# Patient Record
Sex: Male | Born: 1980 | Hispanic: Yes | Smoking: Never smoker
Health system: Southern US, Community
[De-identification: ages and names within clinical notes are randomized; demographics above are authoritative.]

## PROBLEM LIST (undated history)

## (undated) HISTORY — PX: NO PAST SURGERIES: SHX2092

---

## 2012-12-24 ENCOUNTER — Ambulatory Visit: Payer: Self-pay

## 2012-12-24 ENCOUNTER — Other Ambulatory Visit: Payer: Self-pay | Admitting: Occupational Medicine

## 2012-12-24 DIAGNOSIS — M549 Dorsalgia, unspecified: Secondary | ICD-10-CM

## 2015-08-18 ENCOUNTER — Emergency Department (HOSPITAL_COMMUNITY): Payer: No Typology Code available for payment source

## 2015-08-18 ENCOUNTER — Emergency Department (HOSPITAL_COMMUNITY)
Admission: EM | Admit: 2015-08-18 | Discharge: 2015-08-18 | Disposition: A | Discharge status: Home / Self Care | Payer: No Typology Code available for payment source | Attending: Emergency Medicine | Admitting: Emergency Medicine

## 2015-08-18 ENCOUNTER — Encounter (HOSPITAL_COMMUNITY): Payer: Self-pay | Admitting: Emergency Medicine

## 2015-08-18 DIAGNOSIS — Y9389 Activity, other specified: Secondary | ICD-10-CM | POA: Insufficient documentation

## 2015-08-18 DIAGNOSIS — R413 Other amnesia: Secondary | ICD-10-CM | POA: Diagnosis not present

## 2015-08-18 DIAGNOSIS — S0083XA Contusion of other part of head, initial encounter: Secondary | ICD-10-CM | POA: Diagnosis not present

## 2015-08-18 DIAGNOSIS — S29002A Unspecified injury of muscle and tendon of back wall of thorax, initial encounter: Secondary | ICD-10-CM | POA: Diagnosis not present

## 2015-08-18 DIAGNOSIS — Y998 Other external cause status: Secondary | ICD-10-CM | POA: Diagnosis not present

## 2015-08-18 DIAGNOSIS — S199XXA Unspecified injury of neck, initial encounter: Secondary | ICD-10-CM | POA: Diagnosis not present

## 2015-08-18 DIAGNOSIS — Y92410 Unspecified street and highway as the place of occurrence of the external cause: Secondary | ICD-10-CM | POA: Insufficient documentation

## 2015-08-18 MED ORDER — KETOROLAC TROMETHAMINE 60 MG/2ML IM SOLN
60.0000 mg | Freq: Once | INTRAMUSCULAR | Status: AC
  Administered 2015-08-18: 60 mg via INTRAMUSCULAR
  Filled 2015-08-18: qty 2

## 2015-08-18 MED ORDER — METHOCARBAMOL 500 MG PO TABS: 500.0000 mg | ORAL_TABLET | Freq: Two times a day (BID) | ORAL | Status: DC

## 2015-08-18 MED ORDER — IBUPROFEN 400 MG PO TABS: 400.0000 mg | ORAL_TABLET | Freq: Three times a day (TID) | ORAL | Status: DC
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
## 2020-11-07 ENCOUNTER — Other Ambulatory Visit: Payer: Self-pay

## 2020-11-07 DIAGNOSIS — Z20822 Contact with and (suspected) exposure to covid-19: Secondary | ICD-10-CM

## 2020-11-08 LAB — NOVEL CORONAVIRUS, NAA: SARS-CoV-2, NAA: NOT DETECTED

## 2020-11-08 LAB — SARS-COV-2, NAA 2 DAY TAT

## 2022-02-12 ENCOUNTER — Encounter: Payer: Self-pay | Admitting: Orthopedic Surgery

## 2022-02-12 ENCOUNTER — Ambulatory Visit (INDEPENDENT_AMBULATORY_CARE_PROVIDER_SITE_OTHER): Payer: Self-pay

## 2022-02-12 ENCOUNTER — Encounter: Payer: Self-pay | Admitting: Orthopaedic Surgery

## 2022-02-12 ENCOUNTER — Ambulatory Visit (HOSPITAL_COMMUNITY)
Admission: RE | Admit: 2022-02-12 | Discharge: 2022-02-12 | Disposition: A | Discharge status: Home / Self Care | Payer: Self-pay | Source: Ambulatory Visit | Attending: Orthopaedic Surgery | Admitting: Orthopaedic Surgery

## 2022-02-12 ENCOUNTER — Ambulatory Visit (INDEPENDENT_AMBULATORY_CARE_PROVIDER_SITE_OTHER): Payer: Self-pay | Admitting: Orthopaedic Surgery

## 2022-02-12 ENCOUNTER — Ambulatory Visit (INDEPENDENT_AMBULATORY_CARE_PROVIDER_SITE_OTHER): Payer: Self-pay | Admitting: Orthopedic Surgery

## 2022-02-12 ENCOUNTER — Encounter (HOSPITAL_COMMUNITY): Payer: Self-pay | Admitting: Orthopedic Surgery

## 2022-02-12 ENCOUNTER — Other Ambulatory Visit: Payer: Self-pay

## 2022-02-12 VITALS — BP 112/74 | HR 74 | Ht 65.0 in

## 2022-02-12 DIAGNOSIS — M79671 Pain in right foot: Secondary | ICD-10-CM

## 2022-02-12 DIAGNOSIS — S93324A Dislocation of tarsometatarsal joint of right foot, initial encounter: Secondary | ICD-10-CM

## 2022-02-12 NOTE — Addendum Note (Signed)
Addended by: Cherre Huger E on: 02/12/2022 09:39 AM ? ? Modules accepted: Orders ? ?

## 2022-02-12 NOTE — Progress Notes (Signed)
? ?Subjective:  ? ? Patient ID: Tommy Potts, male    DOB: 1980/10/19, 41 y.o.   MRN: [id number] ? ?HPI ?He was up on a ladder working on a house and fell and hurt his right foot on Friday, May 5.  He had pain to continue in the foot over the weekend, he had difficulty standing and walking.  He went to [facility] yesterday, May 8.  X-rays showed Lisfranc injury with fractures of the navicular and medial cuneiform.  Medial cuneiform is medially and dorsally subluxed relative to navicular.  CT exam was recommended. ? ?He has crutches.  He has no other injury.  He has swelling of the right foot but NV intact. ? ?He has interpreter present.  I have explained the injury and the potential seriousness of it. ? ?I will arrange CT of the foot. ? ?I will be out of town until the first of next month. ? ?I will have him seen by Dr. Sharol Given in the [facility] office after the CT exam. ? ?I will give CAM walker and instructions to stay off the foot.  Go to ER if he gets worse.  His wife is also present.  I gave them the opportunity to ask questions and they appear to understand the seriousness of the injury and what may need to be done.  He is able to get to [facility] for follow-up. ? ? ?Review of Systems  ?Constitutional:  Positive for activity change.  ?Musculoskeletal:  Positive for arthralgias, gait problem, joint swelling and myalgias.  ?All other systems reviewed and are negative. ?For Review of Systems, all other systems reviewed and are negative. ? ?The following is a summary of the past history medically, past history surgically, known current medicines, social history and family history.  This information is gathered electronically by the computer from prior information and documentation.  I review this each visit and have found including this information at this point in the chart is beneficial and informative.  ? ?History reviewed. No pertinent past medical history. ? ?History reviewed. No  pertinent surgical history. ? ?Current Outpatient Medications on File Prior to Visit  ?Medication Sig Dispense Refill  ? ibuprofen (ADVIL,MOTRIN) 400 MG tablet Take 1 tablet (400 mg total) by mouth 3 (three) times daily. 30 tablet 0  ? methocarbamol (ROBAXIN) 500 MG tablet Take 1 tablet (500 mg total) by mouth 2 (two) times daily. 20 tablet 0  ? ?No current facility-administered medications on file prior to visit.  ? ? ?Social History  ? ?Socioeconomic History  ? Marital status: Unknown  ?  Spouse name: Not on file  ? Number of children: Not on file  ? Years of education: Not on file  ? Highest education level: Not on file  ?Occupational History  ? Not on file  ?Tobacco Use  ? Smoking status: Never  ? Smokeless tobacco: Not on file  ?Substance and Sexual Activity  ? Alcohol use: No  ? Drug use: No  ? Sexual activity: Not on file  ?Other Topics Concern  ? Not on file  ?Social History Narrative  ? Not on file  ? ?Social Determinants of Health  ? ?Financial Resource Strain: Not on file  ?Food Insecurity: Not on file  ?Transportation Needs: Not on file  ?Physical Activity: Not on file  ?Stress: Not on file  ?Social Connections: Not on file  ?Intimate Partner Violence: Not on file  ? ? ?History reviewed. No pertinent family history. ? ?BP 112/74  Pulse 74   Ht 5\' 5"  (1.651 m)  ? ?There is no height or weight on file to calculate BMI. ? ?   ?Objective:  ? Physical Exam ?Vitals and nursing note reviewed. Exam conducted with a chaperone present.  ?Constitutional:   ?   Appearance: He is well-developed.  ?HENT:  ?   Head: Normocephalic and atraumatic.  ?Eyes:  ?   Conjunctiva/sclera: Conjunctivae normal.  ?   Pupils: Pupils are equal, round, and reactive to light.  ?Cardiovascular:  ?   Rate and Rhythm: Normal rate and regular rhythm.  ?Pulmonary:  ?   Effort: Pulmonary effort is normal.  ?Abdominal:  ?   Palpations: Abdomen is soft.  ?Musculoskeletal:  ?   Cervical back: Normal range of motion and neck supple.  ?      Feet: ? ?Skin: ?   General: Skin is warm and dry.  ?Neurological:  ?   Mental Status: He is alert and oriented to person, place, and time.  ?   Cranial Nerves: No cranial nerve deficit.  ?   Motor: No abnormal muscle tone.  ?   Coordination: Coordination normal.  ?   Deep Tendon Reflexes: Reflexes are normal and symmetric. Reflexes normal.  ?Psychiatric:     ?   Behavior: Behavior normal.     ?   Thought Content: Thought content normal.     ?   Judgment: Judgment normal.  ? ? ? ? ? ?   ?Assessment & Plan:  ? ?Encounter Diagnosis  ?Name Primary?  ? Lisfranc dislocation, right, initial encounter Yes  ? ?To get CT of the right foot. ? ?To follow-up with Dr. Sharol Given. ? ?CAM walker with instructions given. ? ?If get worse, go to ER without delay. ? ?Call if any problem. ? ?Precautions discussed. ? ?Electronically Signed ?Sanjuana Kava, MD ?5/9/20239:36 AM ? ?

## 2022-02-12 NOTE — Progress Notes (Signed)
Spoke with pt via [employer] (Spanish) [place] ID # [id number]. Pt denies cardiac history, HTN or Diabetes.  ? ?Shower instructions given to pt.  ?

## 2022-02-12 NOTE — Progress Notes (Signed)
? ?Office Visit Note ?  ?Patient: Tommy Potts           ?Date of Birth: 10-29-1980           ?MRN: [id number] ?Visit Date: 02/12/2022 ?             ?Requested by: Sanjuana Kava, MD ?[address] ?[place],  [place] [address] ?PCP: Pcp, No ? ?Chief Complaint  ?Patient presents with  ? Right Foot - Pain  ?  Follo wup CT scan Lisfranc fx dislocation   ? ? ? ? ?HPI: ?Patient is a 41 year old gentleman who was seen with an interpreter.  Patient states that he fell off a roof on Friday, May 5 about 4 days ago.  Patient was initially seen in [place] and is seen today for initial evaluation and treatment.  Patient has had a CT scan.  Patient is currently nonweightbearing in a fracture boot and crutches. ? ?Assessment & Plan: ?Visit Diagnoses:  ?1. Pain in right foot   ?2. Lisfranc's dislocation, right, initial encounter   ? ? ?Plan: With the fracture dislocation through the Lisfranc complex patient will need a fusion from the base of the medial cuneiform to the navicular possible internal fixation or fusion from the base of the first metatarsal medial cuneiform and further stabilization between the medial cuneiform and the second metatarsal.  Risks and benefits were discussed including infection neurovascular injury numbness delayed healing of the skin nonhealing of the fusion need for additional surgery.  Patient states he understands wished to proceed at this time. ? ?Follow-Up Instructions: Return in about 1 week (around 02/19/2022).  ? ?Ortho Exam ? ?Patient is alert, oriented, no adenopathy, well-dressed, normal affect, normal respiratory effort. ?Examination patient has a strong palpable dorsalis pedis pulse there is swelling the skin is intact there is no blistering.  The calcaneus is nontender to palpation the ankle is nontender to palpation.  There is no pain with range of motion of the knee or hip.  There is no knee effusion.  Review of the CT scan shows comminution through the base of the first and second  metatarsal with the dislocation and impaction and dislocation of the medial cuneiform dislocated dorsally on the navicular. ? ?Imaging: ?CT FOOT RIGHT WO CONTRAST ? ?Result Date: 02/12/2022 ?CLINICAL DATA:  Fracture, foot lis franc dislocation right foot EXAM: CT OF THE RIGHT FOOT WITHOUT CONTRAST TECHNIQUE: Multidetector CT imaging of the right foot was performed according to the standard protocol. Multiplanar CT image reconstructions were also generated. RADIATION DOSE REDUCTION: This exam was performed according to the departmental dose-optimization program which includes automated exposure control, adjustment of the mA and/or kV according to patient size and/or use of iterative reconstruction technique. COMPARISON:  None Available. FINDINGS: Bones/Joint/Cartilage Lisfranc fracture-dislocation. Comminuted and displaced fracture involving the head plantar lateral base of the medial cuneiform. Small fracture along the plantar aspect of the second metatarsal and small fracture of the plantar medial aspect of the middle cuneiform. There are multiple small fracture fragments in the Lisfranc interval. There is medial and dorsal subluxation of the medial cuneiform with respect to the navicular, with comminuted fracture of the medial navicular due to impaction by the medial cuneiform. There is medial subluxation of the first metatarsal and medial cuneiform with widening of primarily the mid and plantar aspect of Lisfranc interval. The first and second metatarsals are well aligned with their respective TMT articulations with the medial and middle cuneiforms. Ligaments Widened Lisfranc interval with midfoot malalignment suggestive of  Lisfranc ligament tear. Muscles and Tendons No acute myotendinous abnormality by CT. Soft tissues Extensive soft tissue swelling of the foot. IMPRESSION: Lisfranc fracture-dislocation, with fractures involving the plantar lateral base of the medial cuneiform, plantar aspect of the second  metatarsal, and plantar medial aspect of the middle cuneiform. Multiple small fracture fragments in the Lisfranc interval. Medial and dorsal subluxation of the medial cuneiform, with comminuted fracture of the medial navicular due to impaction by the medial cuneiform. Electronically Signed   By: Maurine Simmering M.D.   On: 02/12/2022 11:05  ? ?XR Foot Complete Right ? ?Result Date: 02/12/2022 ?Three-view radiographs of the right foot shows a Lisfranc fracture dislocation with a fracture extending through the base of the medial cuneiform with dislocation and impaction into the navicular.  ?No images are attached to the encounter. ? ?Labs: ?No results found for: HGBA1C, ESRSEDRATE, CRP, LABURIC, REPTSTATUS, GRAMSTAIN, CULT, LABORGA ? ? ?No results found for: ALBUMIN, PREALBUMIN, CBC ? ?No results found for: MG ?No results found for: VD25OH ? ?No results found for: PREALBUMIN ?   ? View : No data to display.  ?  ?  ?  ? ? ? ?There is no height or weight on file to calculate BMI. ? ?Orders:  ?Orders Placed This Encounter  ?Procedures  ? XR Foot Complete Right  ? ?No orders of the defined types were placed in this encounter. ? ? ? Procedures: ?No procedures performed ? ?Clinical Data: ?No additional findings. ? ?ROS: ? ?All other systems negative, except as noted in the HPI. ?Review of Systems ? ?Objective: ?Vital Signs: There were no vitals taken for this visit. ? ?Specialty Comments:  ?No specialty comments available. ? ?PMFS History: ?There are no problems to display for this patient. ? ?History reviewed. No pertinent past medical history.  ?History reviewed. No pertinent family history.  ?History reviewed. No pertinent surgical history. ?Social History  ? ?Occupational History  ? Not on file  ?Tobacco Use  ? Smoking status: Never  ? Smokeless tobacco: Not on file  ?Substance and Sexual Activity  ? Alcohol use: No  ? Drug use: No  ? Sexual activity: Not on file  ? ? ? ? ? ?

## 2022-02-13 ENCOUNTER — Ambulatory Visit (HOSPITAL_COMMUNITY): Payer: Self-pay | Admitting: Anesthesiology

## 2022-02-13 ENCOUNTER — Ambulatory Visit (HOSPITAL_COMMUNITY)
Admission: RE | Admit: 2022-02-13 | Discharge: 2022-02-13 | Disposition: A | Discharge status: Home / Self Care | Payer: Self-pay | Attending: Orthopedic Surgery | Admitting: Orthopedic Surgery

## 2022-02-13 ENCOUNTER — Ambulatory Visit (HOSPITAL_COMMUNITY): Payer: Self-pay

## 2022-02-13 ENCOUNTER — Encounter (HOSPITAL_COMMUNITY)
Admission: RE | Disposition: A | Discharge status: Home / Self Care | Payer: Self-pay | Source: Home / Self Care | Attending: Orthopedic Surgery

## 2022-02-13 ENCOUNTER — Other Ambulatory Visit: Payer: Self-pay

## 2022-02-13 ENCOUNTER — Ambulatory Visit (HOSPITAL_BASED_OUTPATIENT_CLINIC_OR_DEPARTMENT_OTHER): Payer: Self-pay | Admitting: Anesthesiology

## 2022-02-13 DIAGNOSIS — S92321A Displaced fracture of second metatarsal bone, right foot, initial encounter for closed fracture: Secondary | ICD-10-CM | POA: Insufficient documentation

## 2022-02-13 DIAGNOSIS — S9781XA Crushing injury of right foot, initial encounter: Secondary | ICD-10-CM | POA: Insufficient documentation

## 2022-02-13 DIAGNOSIS — S93304A Unspecified dislocation of right foot, initial encounter: Secondary | ICD-10-CM

## 2022-02-13 DIAGNOSIS — S92251A Displaced fracture of navicular [scaphoid] of right foot, initial encounter for closed fracture: Secondary | ICD-10-CM | POA: Insufficient documentation

## 2022-02-13 DIAGNOSIS — S93324A Dislocation of tarsometatarsal joint of right foot, initial encounter: Secondary | ICD-10-CM

## 2022-02-13 DIAGNOSIS — S92244A Nondisplaced fracture of medial cuneiform of right foot, initial encounter for closed fracture: Secondary | ICD-10-CM | POA: Insufficient documentation

## 2022-02-13 DIAGNOSIS — W132XXA Fall from, out of or through roof, initial encounter: Secondary | ICD-10-CM | POA: Insufficient documentation

## 2022-02-13 HISTORY — PX: FOOT ARTHRODESIS: SHX1655

## 2022-02-13 LAB — CBC
HCT: 48.3 % (ref 39.0–52.0)
Hemoglobin: 16.6 g/dL (ref 13.0–17.0)
MCH: 28.3 pg (ref 26.0–34.0)
MCHC: 34.4 g/dL (ref 30.0–36.0)
MCV: 82.3 fL (ref 80.0–100.0)
Platelets: 205 10*3/uL (ref 150–400)
RBC: 5.87 MIL/uL — ABNORMAL HIGH (ref 4.22–5.81)
RDW: 13.7 % (ref 11.5–15.5)
WBC: 9.1 10*3/uL (ref 4.0–10.5)
nRBC: 0 % (ref 0.0–0.2)

## 2022-02-13 SURGERY — FUSION, JOINT, FOOT
Anesthesia: Monitor Anesthesia Care | Site: Foot | Laterality: Right

## 2022-02-13 MED ORDER — LACTATED RINGERS IV SOLN: INTRAVENOUS | Status: DC

## 2022-02-13 MED ORDER — PROPOFOL 10 MG/ML IV BOLUS
INTRAVENOUS | Status: DC | PRN
  Administered 2022-02-13: 20 mg via INTRAVENOUS

## 2022-02-13 MED ORDER — CHLORHEXIDINE GLUCONATE 0.12 % MT SOLN
OROMUCOSAL | Status: AC
  Administered 2022-02-13: 15 mL via OROMUCOSAL
  Filled 2022-02-13: qty 15

## 2022-02-13 MED ORDER — ONDANSETRON HCL 4 MG/2ML IJ SOLN
INTRAMUSCULAR | Status: AC
  Filled 2022-02-13: qty 8

## 2022-02-13 MED ORDER — PHENYLEPHRINE 80 MCG/ML (10ML) SYRINGE FOR IV PUSH (FOR BLOOD PRESSURE SUPPORT)
PREFILLED_SYRINGE | INTRAVENOUS | Status: AC
  Filled 2022-02-13: qty 50

## 2022-02-13 MED ORDER — PROPOFOL 500 MG/50ML IV EMUL
INTRAVENOUS | Status: DC | PRN
  Administered 2022-02-13: 100 ug/kg/min via INTRAVENOUS
  Administered 2022-02-13: 50 mg via INTRAVENOUS

## 2022-02-13 MED ORDER — EPHEDRINE 5 MG/ML INJ
INTRAVENOUS | Status: AC
  Filled 2022-02-13: qty 5

## 2022-02-13 MED ORDER — LIDOCAINE 2% (20 MG/ML) 5 ML SYRINGE
INTRAMUSCULAR | Status: AC
  Filled 2022-02-13: qty 5

## 2022-02-13 MED ORDER — ROCURONIUM BROMIDE 10 MG/ML (PF) SYRINGE
PREFILLED_SYRINGE | INTRAVENOUS | Status: AC
  Filled 2022-02-13: qty 10

## 2022-02-13 MED ORDER — FENTANYL CITRATE (PF) 100 MCG/2ML IJ SOLN
100.0000 ug | Freq: Once | INTRAMUSCULAR | Status: AC
  Administered 2022-02-13: 100 ug via INTRAVENOUS

## 2022-02-13 MED ORDER — MIDAZOLAM HCL 2 MG/2ML IJ SOLN
2.0000 mg | Freq: Once | INTRAMUSCULAR | Status: AC
  Administered 2022-02-13: 2 mg via INTRAVENOUS

## 2022-02-13 MED ORDER — MIDAZOLAM HCL 2 MG/2ML IJ SOLN
INTRAMUSCULAR | Status: AC
  Filled 2022-02-13: qty 2

## 2022-02-13 MED ORDER — ORAL CARE MOUTH RINSE: 15.0000 mL | Freq: Once | OROMUCOSAL | Status: AC

## 2022-02-13 MED ORDER — FENTANYL CITRATE (PF) 100 MCG/2ML IJ SOLN
INTRAMUSCULAR | Status: AC
  Filled 2022-02-13: qty 2

## 2022-02-13 MED ORDER — OXYCODONE-ACETAMINOPHEN 5-325 MG PO TABS: 1.0000 | ORAL_TABLET | ORAL | 0 refills | Status: DC | PRN

## 2022-02-13 MED ORDER — DEXAMETHASONE SODIUM PHOSPHATE 10 MG/ML IJ SOLN
INTRAMUSCULAR | Status: AC
  Filled 2022-02-13: qty 3

## 2022-02-13 MED ORDER — ROPIVACAINE HCL 5 MG/ML IJ SOLN
INTRAMUSCULAR | Status: DC | PRN
  Administered 2022-02-13: 15 mL via PERINEURAL
  Administered 2022-02-13: 25 mL via PERINEURAL

## 2022-02-13 MED ORDER — ONDANSETRON HCL 4 MG/2ML IJ SOLN
INTRAMUSCULAR | Status: DC | PRN
  Administered 2022-02-13: 4 mg via INTRAVENOUS

## 2022-02-13 MED ORDER — PHENYLEPHRINE 80 MCG/ML (10ML) SYRINGE FOR IV PUSH (FOR BLOOD PRESSURE SUPPORT)
PREFILLED_SYRINGE | INTRAVENOUS | Status: DC | PRN
  Administered 2022-02-13 (×2): 80 ug via INTRAVENOUS

## 2022-02-13 MED ORDER — DEXAMETHASONE SODIUM PHOSPHATE 10 MG/ML IJ SOLN
INTRAMUSCULAR | Status: DC | PRN
  Administered 2022-02-13: 5 mg

## 2022-02-13 MED ORDER — CEFAZOLIN SODIUM-DEXTROSE 2-4 GM/100ML-% IV SOLN
INTRAVENOUS | Status: AC
  Filled 2022-02-13: qty 100

## 2022-02-13 MED ORDER — DEXAMETHASONE SODIUM PHOSPHATE 10 MG/ML IJ SOLN
INTRAMUSCULAR | Status: DC | PRN
Start: 2022-02-13 — End: 2022-02-13

## 2022-02-13 MED ORDER — CHLORHEXIDINE GLUCONATE 0.12 % MT SOLN: 15.0000 mL | Freq: Once | OROMUCOSAL | Status: AC

## 2022-02-13 MED ORDER — 0.9 % SODIUM CHLORIDE (POUR BTL) OPTIME
TOPICAL | Status: DC | PRN
  Administered 2022-02-13: 1000 mL

## 2022-02-13 MED ORDER — CEFAZOLIN SODIUM-DEXTROSE 2-4 GM/100ML-% IV SOLN
2.0000 g | INTRAVENOUS | Status: AC
  Administered 2022-02-13: 2 g via INTRAVENOUS

## 2022-02-13 MED ORDER — ACETAMINOPHEN 500 MG PO TABS
1000.0000 mg | ORAL_TABLET | Freq: Once | ORAL | Status: AC
  Administered 2022-02-13: 1000 mg via ORAL
  Filled 2022-02-13: qty 2

## 2022-02-13 SURGICAL SUPPLY — 51 items
BAG COUNTER SPONGE SURGICOUNT (BAG) ×2 IMPLANT
BANDAGE ESMARK 6X9 LF (GAUZE/BANDAGES/DRESSINGS) IMPLANT
BIT DRILL CAL 2.5 ST W/SLV (BIT) ×1 IMPLANT
BLADE SAW SGTL HD 18.5X60.5X1. (BLADE) ×2 IMPLANT
BLADE SURG 10 STRL SS (BLADE) IMPLANT
BNDG COHESIVE 4X5 TAN STRL (GAUZE/BANDAGES/DRESSINGS) ×2 IMPLANT
BNDG COHESIVE 6X5 TAN NS LF (GAUZE/BANDAGES/DRESSINGS) ×1 IMPLANT
BNDG ESMARK 6X9 LF (GAUZE/BANDAGES/DRESSINGS)
BNDG GAUZE ELAST 4 BULKY (GAUZE/BANDAGES/DRESSINGS) ×3 IMPLANT
COTTON STERILE ROLL (GAUZE/BANDAGES/DRESSINGS) ×2 IMPLANT
COVER MAYO STAND STRL (DRAPES) IMPLANT
COVER SURGICAL LIGHT HANDLE (MISCELLANEOUS) ×4 IMPLANT
DRAPE INCISE IOBAN 66X45 STRL (DRAPES) ×2 IMPLANT
DRAPE OEC MINIVIEW 54X84 (DRAPES) IMPLANT
DRAPE U-SHAPE 47X51 STRL (DRAPES) ×2 IMPLANT
DRIVER STRM ST T10 (ORTHOPEDIC DISPOSABLE SUPPLIES) ×1 IMPLANT
DRSG ADAPTIC 3X8 NADH LF (GAUZE/BANDAGES/DRESSINGS) ×2 IMPLANT
DURAPREP 26ML APPLICATOR (WOUND CARE) ×2 IMPLANT
ELECT REM PT RETURN 9FT ADLT (ELECTROSURGICAL) ×2
ELECTRODE REM PT RTRN 9FT ADLT (ELECTROSURGICAL) ×1 IMPLANT
GAUZE SPONGE 4X4 12PLY STRL (GAUZE/BANDAGES/DRESSINGS) ×2 IMPLANT
GLOVE BIOGEL PI IND STRL 9 (GLOVE) ×1 IMPLANT
GLOVE BIOGEL PI INDICATOR 9 (GLOVE) ×1
GLOVE SURG ORTHO 9.0 STRL STRW (GLOVE) ×2 IMPLANT
GOWN STRL REUS W/ TWL XL LVL3 (GOWN DISPOSABLE) ×3 IMPLANT
GOWN STRL REUS W/TWL XL LVL3 (GOWN DISPOSABLE) ×3
GUIDEWIRE 1.6 6IN (WIRE) ×2 IMPLANT
KIT BASIN OR (CUSTOM PROCEDURE TRAY) ×2 IMPLANT
KIT STRATUM INSTRUMENT STD (KITS) ×1 IMPLANT
KIT TURNOVER KIT B (KITS) ×2 IMPLANT
MANIFOLD NEPTUNE II (INSTRUMENTS) ×2 IMPLANT
NS IRRIG 1000ML POUR BTL (IV SOLUTION) ×2 IMPLANT
PACK ORTHO EXTREMITY (CUSTOM PROCEDURE TRAY) ×2 IMPLANT
PAD ABD 8X10 STRL (GAUZE/BANDAGES/DRESSINGS) ×1 IMPLANT
PAD ARMBOARD 7.5X6 YLW CONV (MISCELLANEOUS) ×4 IMPLANT
PAD CAST 4YDX4 CTTN HI CHSV (CAST SUPPLIES) ×1 IMPLANT
PADDING CAST COTTON 4X4 STRL (CAST SUPPLIES) ×1
PLATE MCF STRM SM RT (Plate) ×1 IMPLANT
SCREW CANN HDLS SHORT 4X30 (Screw) ×1 IMPLANT
SCREW CANN SHT HDLS 4X38 (Screw) ×1 IMPLANT
SCREW LOCK STRATUM 3.5X16 (Screw) ×1 IMPLANT
SCREW LOCK STRATUM 3.5X20 (Screw) ×2 IMPLANT
SCREW NL LP STRATUM 3.5X16 (Screw) ×1 IMPLANT
SPONGE T-LAP 18X18 ~~LOC~~+RFID (SPONGE) ×2 IMPLANT
SUCTION FRAZIER HANDLE 10FR (MISCELLANEOUS) ×1
SUCTION TUBE FRAZIER 10FR DISP (MISCELLANEOUS) ×1 IMPLANT
SUT ETHILON 2 0 PSLX (SUTURE) ×6 IMPLANT
TOWEL GREEN STERILE (TOWEL DISPOSABLE) ×2 IMPLANT
TOWEL GREEN STERILE FF (TOWEL DISPOSABLE) ×2 IMPLANT
TUBE CONNECTING 12X1/4 (SUCTIONS) ×2 IMPLANT
WATER STERILE IRR 1000ML POUR (IV SOLUTION) ×2 IMPLANT

## 2022-02-13 NOTE — Transfer of Care (Signed)
Immediate Anesthesia Transfer of Care Note ? ?Patient: Tommy Potts ? ?Procedure(s) Performed: FUSION RIGHT MIDFOOT (Right: Foot) ? ?Patient Location: PACU ? ?Anesthesia Type:MAC combined with regional for post-op pain ? ?Level of Consciousness: awake and drowsy ? ?Airway & Oxygen Therapy: Patient Spontanous Breathing ? ?Post-op Assessment: Report given to RN and Post -op Vital signs reviewed and stable ? ?Post vital signs: Reviewed and stable ? ?Last Vitals:  ?Vitals Value Taken Time  ?BP 114/73 02/13/22 1553  ?Temp 36.9 ?C 02/13/22 1552  ?Pulse 73 02/13/22 1554  ?Resp 9 02/13/22 1554  ?SpO2 95 % 02/13/22 1554  ?Vitals shown include unvalidated device data. ? ?Last Pain:  ?Vitals:  ? 02/13/22 1304  ?TempSrc:   ?PainSc: 0-No pain  ?   ? ?  ? ?Complications: No notable events documented. ?

## 2022-02-13 NOTE — Anesthesia Procedure Notes (Signed)
Anesthesia Regional Block: Adductor canal block  ? ?Pre-Anesthetic Checklist: , timeout performed,  Correct Patient, Correct Site, Correct Laterality,  Correct Procedure, Correct Position, site marked,  Risks and benefits discussed,  Surgical consent,  Pre-op evaluation,  At surgeon's request and post-op pain management ? ?Laterality: Right ? ?Prep: chloraprep     ?  ?Needles:  ?Injection technique: Single-shot ? ?Needle Type: Echogenic Stimulator Needle   ? ? ?Needle Length: 10cm  ?Needle Gauge: 20  ? ? ? ?Additional Needles: ? ? ?Procedures:,,,, ultrasound used (permanent image in chart),,    ?Narrative:  ?Start time: 02/13/2022 1:42 PM ?End time: 02/13/2022 1:45 PM ?Injection made incrementally with aspirations every 5 mL. ? ?Performed by: Personally  ?Anesthesiologist: Lidia Collum, MD ? ?Additional Notes: ?Standard monitors applied. Skin prepped. Good needle visualization with ultrasound. Injection made in 5cc increments with no resistance to injection. Patient tolerated the procedure well. ? ? ? ? ?

## 2022-02-13 NOTE — Anesthesia Preprocedure Evaluation (Signed)
Anesthesia Evaluation  ?Patient identified by MRN, date of birth, ID band ?Patient awake ? ? ? ?Reviewed: ?Allergy & Precautions, NPO status , Patient's Chart, lab work & pertinent test results ? ?History of Anesthesia Complications ?Negative for: history of anesthetic complications ? ?Airway ?Mallampati: II ? ?TM Distance: >3 FB ?Neck ROM: Full ? ? ? Dental ? ?(+) Teeth Intact ?  ?Pulmonary ?neg pulmonary ROS,  ?  ?Pulmonary exam normal ? ? ? ? ? ? ? Cardiovascular ?negative cardio ROS ?Normal cardiovascular exam ? ? ?  ?Neuro/Psych ?negative neurological ROS ?   ? GI/Hepatic ?negative GI ROS, Neg liver ROS,   ?Endo/Other  ?negative endocrine ROS ? Renal/GU ?negative Renal ROS  ?negative genitourinary ?  ?Musculoskeletal ?negative musculoskeletal ROS ?(+)  ? Abdominal ?  ?Peds ? Hematology ?negative hematology ROS ?(+)   ?Anesthesia Other Findings ? ? Reproductive/Obstetrics ? ?  ? ? ? ? ? ? ? ? ? ? ? ? ? ?  ?  ? ? ? ? ? ? ? ?Anesthesia Physical ?Anesthesia Plan ? ?ASA: 1 ? ?Anesthesia Plan: MAC and Regional  ? ?Post-op Pain Management: Regional block*, Tylenol PO (pre-op)* and Toradol IV (intra-op)*  ? ?Induction: Intravenous ? ?PONV Risk Score and Plan: 1 and Propofol infusion, TIVA and Treatment may vary due to age or medical condition ? ?Airway Management Planned: Natural Airway, Nasal Cannula and Simple Face Mask ? ?Additional Equipment: None ? ?Intra-op Plan:  ? ?Post-operative Plan:  ? ?Informed Consent: I have reviewed the patients History and Physical, chart, labs and discussed the procedure including the risks, benefits and alternatives for the proposed anesthesia with the patient or authorized representative who has indicated his/her understanding and acceptance.  ? ? ? ? ? ?Plan Discussed with:  ? ?Anesthesia Plan Comments:   ? ? ? ? ? ?Anesthesia Quick Evaluation ? ?

## 2022-02-13 NOTE — H&P (Signed)
Tommy Potts is an 41 y.o. male.   ?Chief Complaint: Lisfranc dislocation right midfoot ?HPI: Patient is a 41 year old gentleman who was seen with an interpreter.  Patient states that he fell off a roof on Friday, May 5 about 4 days ago.  Patient was initially seen in [place] and is seen today for initial evaluation and treatment.  Patient has had a CT scan.  Patient is currently nonweightbearing in a fracture boot and crutches. ? ?History reviewed. No pertinent past medical history. ? ?Past Surgical History:  ?Procedure Laterality Date  ? NO PAST SURGERIES    ? ? ?History reviewed. No pertinent family history. ?Social History:  reports that he has never smoked. He has never used smokeless tobacco. He reports that he does not drink alcohol and does not use drugs. ? ?Allergies: No Known Allergies ? ?No medications prior to admission.  ? ? ?No results found for this or any previous visit (from the past 48 hour(s)). ?CT FOOT RIGHT WO CONTRAST ? ?Result Date: 02/12/2022 ?CLINICAL DATA:  Fracture, foot lis franc dislocation right foot EXAM: CT OF THE RIGHT FOOT WITHOUT CONTRAST TECHNIQUE: Multidetector CT imaging of the right foot was performed according to the standard protocol. Multiplanar CT image reconstructions were also generated. RADIATION DOSE REDUCTION: This exam was performed according to the departmental dose-optimization program which includes automated exposure control, adjustment of the mA and/or kV according to patient size and/or use of iterative reconstruction technique. COMPARISON:  None Available. FINDINGS: Bones/Joint/Cartilage Lisfranc fracture-dislocation. Comminuted and displaced fracture involving the head plantar lateral base of the medial cuneiform. Small fracture along the plantar aspect of the second metatarsal and small fracture of the plantar medial aspect of the middle cuneiform. There are multiple small fracture fragments in the Lisfranc interval. There is medial and dorsal subluxation of  the medial cuneiform with respect to the navicular, with comminuted fracture of the medial navicular due to impaction by the medial cuneiform. There is medial subluxation of the first metatarsal and medial cuneiform with widening of primarily the mid and plantar aspect of Lisfranc interval. The first and second metatarsals are well aligned with their respective TMT articulations with the medial and middle cuneiforms. Ligaments Widened Lisfranc interval with midfoot malalignment suggestive of Lisfranc ligament tear. Muscles and Tendons No acute myotendinous abnormality by CT. Soft tissues Extensive soft tissue swelling of the foot. IMPRESSION: Lisfranc fracture-dislocation, with fractures involving the plantar lateral base of the medial cuneiform, plantar aspect of the second metatarsal, and plantar medial aspect of the middle cuneiform. Multiple small fracture fragments in the Lisfranc interval. Medial and dorsal subluxation of the medial cuneiform, with comminuted fracture of the medial navicular due to impaction by the medial cuneiform. Electronically Signed   By: Caprice Renshaw M.D.   On: 02/12/2022 11:05  ? ?XR Foot Complete Right ? ?Result Date: 02/12/2022 ?Three-view radiographs of the right foot shows a Lisfranc fracture dislocation with a fracture extending through the base of the medial cuneiform with dislocation and impaction into the navicular.  ? ?Review of Systems ? ?There were no vitals taken for this visit. ?Physical Exam  ?Patient is alert, oriented, no adenopathy, well-dressed, normal affect, normal respiratory effort. ?Examination patient has a strong palpable dorsalis pedis pulse there is swelling the skin is intact there is no blistering.  The calcaneus is nontender to palpation the ankle is nontender to palpation.  There is no pain with range of motion of the knee or hip.  There is no knee effusion.  Review  of the CT scan shows comminution through the base of the first and second metatarsal with the  dislocation and impaction and dislocation of the medial cuneiform dislocated dorsally on the navicular. ?Assessment/Plan ?1. Pain in right foot   ?2. Lisfranc's dislocation, right, initial encounter   ?  ?  ?Plan: With the fracture dislocation through the Lisfranc complex patient will need a fusion from the base of the medial cuneiform to the navicular possible internal fixation or fusion from the base of the first metatarsal medial cuneiform and further stabilization between the medial cuneiform and the second metatarsal.  Risks and benefits were discussed including infection neurovascular injury numbness delayed healing of the skin nonhealing of the fusion need for additional surgery.  Patient states he understands wished to proceed at this time. ? ?Nadara Mustard, MD ?02/13/2022, 8:14 AM ? ? ? ?

## 2022-02-13 NOTE — Anesthesia Procedure Notes (Signed)
Anesthesia Regional Block: Popliteal block  ? ?Pre-Anesthetic Checklist: , timeout performed,  Correct Patient, Correct Site, Correct Laterality,  Correct Procedure, Correct Position, site marked,  Risks and benefits discussed,  Surgical consent,  Pre-op evaluation,  At surgeon's request and post-op pain management ? ?Laterality: Right ? ?Prep: chloraprep     ?  ?Needles:  ?Injection technique: Single-shot ? ?Needle Type: Echogenic Stimulator Needle   ? ? ?Needle Length: 10cm  ?Needle Gauge: 20  ? ? ? ?Additional Needles: ? ? ?Procedures:,,,, ultrasound used (permanent image in chart),,    ?Narrative:  ?Start time: 02/13/2022 1:45 PM ?End time: 02/13/2022 1:47 PM ? ?Performed by: Personally  ?Anesthesiologist: [name], MD ? ?Additional Notes: ?Standard monitors applied. Skin prepped. Good needle visualization with ultrasound. Injection made in 5cc increments with no resistance to injection. Patient tolerated the procedure well. ? ? ? ? ?

## 2022-02-13 NOTE — Anesthesia Postprocedure Evaluation (Signed)
Anesthesia Post Note ? ?Patient: Tommy Potts ? ?Procedure(s) Performed: FUSION RIGHT MIDFOOT (Right: Foot) ? ?  ? ?Patient location during evaluation: PACU ?Anesthesia Type: Regional ?Level of consciousness: awake and alert ?Pain management: pain level controlled ?Vital Signs Assessment: post-procedure vital signs reviewed and stable ?Respiratory status: spontaneous breathing, nonlabored ventilation and respiratory function stable ?Cardiovascular status: blood pressure returned to baseline and stable ?Postop Assessment: no apparent nausea or vomiting ?Anesthetic complications: no ? ? ?No notable events documented. ? ?Last Vitals:  ?Vitals:  ? 02/13/22 1608 02/13/22 1624  ?BP: 106/74 110/79  ?Pulse: 70 66  ?Resp: 17 15  ?Temp:    ?SpO2: 94% 93%  ?  ?Last Pain:  ?Vitals:  ? 02/13/22 1624  ?TempSrc:   ?PainSc: 0-No pain  ? ? ?  ?  ?  ?  ?  ?  ? ?[name] ? ? ? ? ?

## 2022-02-13 NOTE — Op Note (Signed)
02/13/2022 ? ?4:01 PM ? ?PATIENT:  Tommy Potts   ? ?PRE-OPERATIVE DIAGNOSIS:  Dislocation Right Midfoot ? ?POST-OPERATIVE DIAGNOSIS:  Same ? ?PROCEDURE:  FUSION RIGHT MIDFOOT ? ?SURGEON:  Newt Minion, MD ? ?PHYSICIAN ASSISTANT:None ?ANESTHESIA:   General ? ?PREOPERATIVE INDICATIONS:  Tommy Scaletta is a  41 y.o. male with a diagnosis of Dislocation Right Midfoot who failed conservative measures and elected for surgical management.   ? ?The risks benefits and alternatives were discussed with the patient preoperatively including but not limited to the risks of infection, bleeding, nerve injury, cardiopulmonary complications, the need for revision surgery, among others, and the patient was willing to proceed. ? ?OPERATIVE IMPLANTS: Medial column fusion plate from [employer] ? ?@ENCIMAGES @ ? ?OPERATIVE FINDINGS: C-arm fluoroscopy verified alignment in both AP and lateral planes. ? ?OPERATIVE PROCEDURE: Patient was brought the operating room after undergoing a regional anesthetic.  After adequate levels anesthesia were obtained patient's right lower extremity was prepped using DuraPrep draped into a sterile field a timeout was called.  A dorsal incision was made between the interval of the anterior tibial tendon and the EHL tendon.  This was carried down to bone through this interval and using subperiosteal dissection the dislocation was identified.  The instability extended through the interval between the first and second metatarsal extended up through the middle and medial cuneiform and extended out through a fracture line between the medial cuneiform and the navicular with the medial cuneiform dorsally dislocated onto the navicular with crush injury to the navicular.  Subperiosteal dissection was used to cleanse the dislocation sites.  A tenaculum was placed to pull the medial column distally to align the Lisfranc complex.  This was stabilized a oscillating saw was used to debride the damaged articular cartilage between  the medial cuneiform and the navicular.  This allowed for bony apposition and reduction of the joint.  K wires were then placed from the medial cuneiform to the base of the second metatarsal to stabilize the Lisfranc complex and a second K wire was placed from the medial to middle cuneiform to stabilize the dislocation between the cuneiforms.  2 K wires were then placed into the navicular using the guide and the plate was placed dorsally with the 2 tines of the plate impacted into the navicular.  C-arm possibly verified alignment.  A compression and locking screws were placed distally in the first metatarsal this kept the medial column out to length and reduced.  Took 4.0 compression screws were then placed one across the Lisfranc complex and the second between the middle and medial cuneiform.  Further screws were placed locking to stabilize the construct.  C-arm possibly verified alignment in both AP and lateral oblique planes.  The wound was irrigated with normal saline incision closed using 2-0 nylon and a sterile dressing was applied patient was taken the PACU in stable condition. ? ? ?DISCHARGE PLANNING: ? ?Antibiotic duration: Preoperative antibiotics ? ?Weightbearing: Strict nonweightbearing on the right ? ?Pain medication: Prescription for Percocet sent to the pharmacy ? ?Dressing care/ Wound VAC: Dry dressing reinforce as needed ? ?Ambulatory devices: Crutches ? ?Discharge to: Home. ? ?Follow-up: In the office 1 week post operative. ? ? ? ? ? ? ?  ?

## 2022-02-13 NOTE — Interval H&P Note (Signed)
History and Physical Interval Note: ? ?02/13/2022 ?2:12 PM ? ?[name]  has presented today for surgery, with the diagnosis of Dislocation Right Midfoot.  The various methods of treatment have been discussed with the patient and family. After consideration of risks, benefits and other options for treatment, the patient has consented to  Procedure(s): ?FUSION RIGHT MIDFOOT (Right) as a surgical intervention.  The patient's history has been reviewed, patient examined, no change in status, stable for surgery.  I have reviewed the patient's chart and labs.  Questions were answered to the patient's satisfaction.   ? ? ?Tommy Potts ? ? ?

## 2022-02-14 ENCOUNTER — Encounter (HOSPITAL_COMMUNITY): Payer: Self-pay | Admitting: Orthopedic Surgery

## 2022-02-16 ENCOUNTER — Encounter (HOSPITAL_COMMUNITY): Payer: Self-pay | Admitting: Orthopedic Surgery

## 2022-02-20 ENCOUNTER — Ambulatory Visit (INDEPENDENT_AMBULATORY_CARE_PROVIDER_SITE_OTHER): Payer: Self-pay

## 2022-02-20 ENCOUNTER — Encounter: Payer: Self-pay | Admitting: Family

## 2022-02-20 ENCOUNTER — Ambulatory Visit (INDEPENDENT_AMBULATORY_CARE_PROVIDER_SITE_OTHER): Payer: Self-pay | Admitting: Family

## 2022-02-20 DIAGNOSIS — M79671 Pain in right foot: Secondary | ICD-10-CM

## 2022-02-20 NOTE — Progress Notes (Signed)
? ?  Post-Op Visit Note ?  ?Patient: Tommy Potts           ?Date of Birth: [birth year]           ?MRN: [id number] ?Visit Date: 02/20/2022 ?PCP: Pcp, No ? ?Chief Complaint:  ?Chief Complaint  ?Patient presents with  ?? Right Foot - Routine Post Op  ?  02/13/22 midfoot fusion  ? ? ?HPI:  ?HPI ?The patient is a 41 year old gentleman who presents status post right midfoot fusion following a fall from a roof with Lisfranc fracture right foot he has been nonweightbearing with crutches.  States he has a postop shoe at home. ? ?States began having intense pain felt a pop and heard a pop in his foot and ankle when getting out of bed yesterday is no longer having intense pain ? ?Ortho Exam ?On examination of the right foot the incision is well approximated sutures this is healing well there is minimal edema no erythema no drainage ? ?Visit Diagnoses:  ?1. Pain in right foot   ? ? ?Plan: Begin daily Dial soap cleansing.  Dry dressing changes.  Follow-up in the office in 2 weeks with repeat radiographs.  Continue nonweightbearing ? ?Follow-Up Instructions: No follow-ups on file.  ? ?Imaging: ?No results found. ? ?Orders:  ?Orders Placed This Encounter  ?Procedures  ?? XR Foot Complete Right  ? ?No orders of the defined types were placed in this encounter. ? ? ? ?PMFS History: ?Patient Active Problem List  ? Diagnosis Date Noted  ?? Lisfranc dislocation, right, initial encounter   ? ?No past medical history on file.  ?No family history on file.  ?Past Surgical History:  ?Procedure Laterality Date  ?? FOOT ARTHRODESIS Right 02/13/2022  ? Procedure: FUSION RIGHT MIDFOOT;  Surgeon: Nadara Mustard, MD;  Location: [facility] OR;  Service: Orthopedics;  Laterality: Right;  ?? NO PAST SURGERIES    ? ?Social History  ? ?Occupational History  ?? Not on file  ?Tobacco Use  ?? Smoking status: Never  ?? Smokeless tobacco: Never  ?Substance and Sexual Activity  ?? Alcohol use: No  ?? Drug use: No  ?? Sexual activity: Not on file  ? ? ?

## 2022-03-07 ENCOUNTER — Ambulatory Visit (INDEPENDENT_AMBULATORY_CARE_PROVIDER_SITE_OTHER): Payer: Self-pay

## 2022-03-07 ENCOUNTER — Ambulatory Visit (INDEPENDENT_AMBULATORY_CARE_PROVIDER_SITE_OTHER): Payer: Self-pay | Admitting: Orthopedic Surgery

## 2022-03-07 DIAGNOSIS — M79671 Pain in right foot: Secondary | ICD-10-CM

## 2022-03-07 DIAGNOSIS — S93324A Dislocation of tarsometatarsal joint of right foot, initial encounter: Secondary | ICD-10-CM

## 2022-03-08 ENCOUNTER — Encounter: Payer: Self-pay | Admitting: Orthopedic Surgery

## 2022-03-08 NOTE — Progress Notes (Signed)
   Office Visit Note   Patient: Tommy Potts           Date of Birth: 1981/01/11           MRN: [id number] Visit Date: 03/07/2022              Requested by: No referring provider defined for this encounter. PCP: Pcp, No  Chief Complaint  Patient presents with   Right Foot - Routine Post Op    02/13/2022 right midfoot fusion       HPI: Patient is a 41 year old gentleman who is status post right midfoot fusion for Charcot collapse.  He is nonweightbearing in a fracture boot and crutches.  Assessment & Plan: Visit Diagnoses:  1. Pain in right foot   2. Lisfranc's dislocation, right, initial encounter     Plan: Wash the wound with soap and water and Ace wrap and a 4 x 4 and elevate work on range of motion of the ankle and toes.  Three-view radiographs of the right foot at follow-up.  Follow-Up Instructions: Return in about 2 weeks (around 03/21/2022).   Ortho Exam  Patient is alert, oriented, no adenopathy, well-dressed, normal affect, normal respiratory effort. Examination the incision is well-healed clean and dry there is no cellulitis.  Imaging: No results found. No images are attached to the encounter.  Labs: No results found for: HGBA1C, ESRSEDRATE, CRP, LABURIC, REPTSTATUS, GRAMSTAIN, CULT, LABORGA   No results found for: ALBUMIN, PREALBUMIN, CBC  No results found for: MG No results found for: VD25OH  No results found for: PREALBUMIN    Latest Ref Rng & Units 02/13/2022    1:06 PM  CBC EXTENDED  WBC 4.0 - 10.5 K/uL 9.1    RBC 4.22 - 5.81 MIL/uL 5.87    Hemoglobin 13.0 - 17.0 g/dL 42.6    HCT 83.4 - 19.6 % 48.3    Platelets 150 - 400 K/uL 205       There is no height or weight on file to calculate BMI.  Orders:  Orders Placed This Encounter  Procedures   XR Foot Complete Right   No orders of the defined types were placed in this encounter.    Procedures: No procedures performed  Clinical Data: No additional findings.  ROS:  All other  systems negative, except as noted in the HPI. Review of Systems  Objective: Vital Signs: There were no vitals taken for this visit.  Specialty Comments:  No specialty comments available.  PMFS History: Patient Active Problem List   Diagnosis Date Noted   Lisfranc dislocation, right, initial encounter    History reviewed. No pertinent past medical history.  History reviewed. No pertinent family history.  Past Surgical History:  Procedure Laterality Date   FOOT ARTHRODESIS Right 02/13/2022   Procedure: FUSION RIGHT MIDFOOT;  Surgeon: Nadara Mustard, MD;  Location: [facility] OR;  Service: Orthopedics;  Laterality: Right;   NO PAST SURGERIES     Social History   Occupational History   Not on file  Tobacco Use   Smoking status: Never   Smokeless tobacco: Never  Substance and Sexual Activity   Alcohol use: No   Drug use: No   Sexual activity: Not on file

## 2022-03-21 ENCOUNTER — Ambulatory Visit (INDEPENDENT_AMBULATORY_CARE_PROVIDER_SITE_OTHER): Payer: Self-pay

## 2022-03-21 ENCOUNTER — Ambulatory Visit (INDEPENDENT_AMBULATORY_CARE_PROVIDER_SITE_OTHER): Payer: Self-pay | Admitting: Orthopedic Surgery

## 2022-03-21 DIAGNOSIS — S93324A Dislocation of tarsometatarsal joint of right foot, initial encounter: Secondary | ICD-10-CM

## 2022-03-24 ENCOUNTER — Encounter: Payer: Self-pay | Admitting: Orthopedic Surgery

## 2022-03-24 NOTE — Progress Notes (Signed)
   Office Visit Note   Patient: Tommy Potts           Date of Birth: 1981/04/10           MRN: [id number] Visit Date: 03/21/2022              Requested by: No referring provider defined for this encounter. PCP: Pcp, No  Chief Complaint  Patient presents with   Right Foot - Follow-up      HPI: Patient is a 41 year old gentleman 5 weeks status post right midfoot fusion.  Patient is ambulating with a cam boot and crutches.  Assessment & Plan: Visit Diagnoses:  1. Lisfranc's dislocation, right, initial encounter     Plan: Sutures are harvested continue crutches for 4 weeks.  Three-view radiographs of the right foot at follow-up.  Recommended Achilles stretching and range of motion of the toes.  Follow-Up Instructions: Return in about 4 weeks (around 04/18/2022).   Ortho Exam  Patient is alert, oriented, no adenopathy, well-dressed, normal affect, normal respiratory effort. Examination the incision is well-healed there is no redness no cellulitis.  Sutures are harvested.  Imaging: No results found. No images are attached to the encounter.  Labs: No results found for: "HGBA1C", "ESRSEDRATE", "CRP", "LABURIC", "REPTSTATUS", "GRAMSTAIN", "CULT", "LABORGA"   No results found for: "ALBUMIN", "PREALBUMIN", "CBC"  No results found for: "MG" No results found for: "VD25OH"  No results found for: "PREALBUMIN"    Latest Ref Rng & Units 02/13/2022    1:06 PM  CBC EXTENDED  WBC 4.0 - 10.5 K/uL 9.1   RBC 4.22 - 5.81 MIL/uL 5.87   Hemoglobin 13.0 - 17.0 g/dL 90.2   HCT 40.9 - 73.5 % 48.3   Platelets 150 - 400 K/uL 205      There is no height or weight on file to calculate BMI.  Orders:  Orders Placed This Encounter  Procedures   XR Foot Complete Right   No orders of the defined types were placed in this encounter.    Procedures: No procedures performed  Clinical Data: No additional findings.  ROS:  All other systems negative, except as noted in the  HPI. Review of Systems  Objective: Vital Signs: There were no vitals taken for this visit.  Specialty Comments:  No specialty comments available.  PMFS History: Patient Active Problem List   Diagnosis Date Noted   Lisfranc dislocation, right, initial encounter    History reviewed. No pertinent past medical history.  History reviewed. No pertinent family history.  Past Surgical History:  Procedure Laterality Date   FOOT ARTHRODESIS Right 02/13/2022   Procedure: FUSION RIGHT MIDFOOT;  Surgeon: Nadara Mustard, MD;  Location: [facility] OR;  Service: Orthopedics;  Laterality: Right;   NO PAST SURGERIES     Social History   Occupational History   Not on file  Tobacco Use   Smoking status: Never   Smokeless tobacco: Never  Substance and Sexual Activity   Alcohol use: No   Drug use: No   Sexual activity: Not on file

## 2022-04-11 ENCOUNTER — Ambulatory Visit (INDEPENDENT_AMBULATORY_CARE_PROVIDER_SITE_OTHER): Payer: Self-pay | Admitting: Orthopedic Surgery

## 2022-04-11 ENCOUNTER — Ambulatory Visit (INDEPENDENT_AMBULATORY_CARE_PROVIDER_SITE_OTHER): Payer: Self-pay

## 2022-04-11 DIAGNOSIS — S93324A Dislocation of tarsometatarsal joint of right foot, initial encounter: Secondary | ICD-10-CM

## 2022-04-30 ENCOUNTER — Encounter: Payer: Self-pay | Admitting: Orthopedic Surgery

## 2022-04-30 NOTE — Progress Notes (Signed)
   Office Visit Note   Patient: Tommy Potts           Date of Birth: 1981-06-09           MRN: [id number] Visit Date: 04/11/2022              Requested by: No referring provider defined for this encounter. PCP: Pcp, No  Chief Complaint  Patient presents with   Right Foot - Routine Post Op    02/13/22 right midfoot fusion      HPI: Patient is a 41 year old gentleman who is 2 months status post medial column fusion right foot.  Patient is currently ambulating in a fracture boot.  Assessment & Plan: Visit Diagnoses:  1. Lisfranc's dislocation, right, initial encounter     Plan: Continue with the fracture boot with repeat three-view radiographs of the right foot at follow-up.  At that time we will release in a stiff soled shoe.  Follow-Up Instructions: Return in about 4 weeks (around 05/09/2022).   Ortho Exam  Patient is alert, oriented, no adenopathy, well-dressed, normal affect, normal respiratory effort. Examination the incision is healing well there is no redness or cellulitis no loss or reduction of the internal fixation.  Imaging: No results found. No images are attached to the encounter.  Labs: No results found for: "HGBA1C", "ESRSEDRATE", "CRP", "LABURIC", "REPTSTATUS", "GRAMSTAIN", "CULT", "LABORGA"   No results found for: "ALBUMIN", "PREALBUMIN", "CBC"  No results found for: "MG" No results found for: "VD25OH"  No results found for: "PREALBUMIN"    Latest Ref Rng & Units 02/13/2022    1:06 PM  CBC EXTENDED  WBC 4.0 - 10.5 K/uL 9.1   RBC 4.22 - 5.81 MIL/uL 5.87   Hemoglobin 13.0 - 17.0 g/dL 83.1   HCT 51.7 - 61.6 % 48.3   Platelets 150 - 400 K/uL 205      There is no height or weight on file to calculate BMI.  Orders:  Orders Placed This Encounter  Procedures   XR Foot Complete Right   No orders of the defined types were placed in this encounter.    Procedures: No procedures performed  Clinical Data: No additional findings.  ROS:  All  other systems negative, except as noted in the HPI. Review of Systems  Objective: Vital Signs: There were no vitals taken for this visit.  Specialty Comments:  No specialty comments available.  PMFS History: Patient Active Problem List   Diagnosis Date Noted   Lisfranc dislocation, right, initial encounter    History reviewed. No pertinent past medical history.  History reviewed. No pertinent family history.  Past Surgical History:  Procedure Laterality Date   FOOT ARTHRODESIS Right 02/13/2022   Procedure: FUSION RIGHT MIDFOOT;  Surgeon: Nadara Mustard, MD;  Location: [facility] OR;  Service: Orthopedics;  Laterality: Right;   NO PAST SURGERIES     Social History   Occupational History   Not on file  Tobacco Use   Smoking status: Never   Smokeless tobacco: Never  Substance and Sexual Activity   Alcohol use: No   Drug use: No   Sexual activity: Not on file

## 2022-05-13 ENCOUNTER — Ambulatory Visit (INDEPENDENT_AMBULATORY_CARE_PROVIDER_SITE_OTHER): Payer: Self-pay

## 2022-05-13 ENCOUNTER — Ambulatory Visit (INDEPENDENT_AMBULATORY_CARE_PROVIDER_SITE_OTHER): Payer: Self-pay | Admitting: Orthopedic Surgery

## 2022-05-13 ENCOUNTER — Encounter: Payer: Self-pay | Admitting: Orthopedic Surgery

## 2022-05-13 DIAGNOSIS — S93324D Dislocation of tarsometatarsal joint of right foot, subsequent encounter: Secondary | ICD-10-CM

## 2022-05-13 DIAGNOSIS — S93324A Dislocation of tarsometatarsal joint of right foot, initial encounter: Secondary | ICD-10-CM

## 2022-05-13 NOTE — Progress Notes (Signed)
   Office Visit Note   Patient: Tommy Potts           Date of Birth: 07/09/1981           MRN: [id number] Visit Date: 05/13/2022              Requested by: No referring provider defined for this encounter. PCP: Pcp, No  Chief Complaint  Patient presents with   Right Foot - Follow-up      HPI: Patient is a 41 year old gentleman who presents 43-month status post right foot midfoot fusion for Lisfranc dislocation.  Assessment & Plan: Visit Diagnoses:  1. Lisfranc's dislocation, right, subsequent encounter   2. Lisfranc's dislocation, right, initial encounter     Plan: Increase activities as tolerated work on range of motion of the toes and ankle continue with scar massage.  Patient is released without restrictions.  Follow-Up Instructions: Return if symptoms worsen or fail to improve.   Ortho Exam  Patient is alert, oriented, no adenopathy, well-dressed, normal affect, normal respiratory effort. Examination the incision is well-healed he has good ankle and subtalar motion.  Imaging: XR Foot Complete Right  Result Date: 05/13/2022 Three-view radiographs of the right foot shows stable internal fixation no hardware failure no lucency around the hardware.  No images are attached to the encounter.  Labs: No results found for: "HGBA1C", "ESRSEDRATE", "CRP", "LABURIC", "REPTSTATUS", "GRAMSTAIN", "CULT", "LABORGA"   No results found for: "ALBUMIN", "PREALBUMIN", "CBC"  No results found for: "MG" No results found for: "VD25OH"  No results found for: "PREALBUMIN"    Latest Ref Rng & Units 02/13/2022    1:06 PM  CBC EXTENDED  WBC 4.0 - 10.5 K/uL 9.1   RBC 4.22 - 5.81 MIL/uL 5.87   Hemoglobin 13.0 - 17.0 g/dL 40.9   HCT 81.1 - 91.4 % 48.3   Platelets 150 - 400 K/uL 205      There is no height or weight on file to calculate BMI.  Orders:  Orders Placed This Encounter  Procedures   XR Foot Complete Right   No orders of the defined types were placed in this  encounter.    Procedures: No procedures performed  Clinical Data: No additional findings.  ROS:  All other systems negative, except as noted in the HPI. Review of Systems  Objective: Vital Signs: There were no vitals taken for this visit.  Specialty Comments:  No specialty comments available.  PMFS History: Patient Active Problem List   Diagnosis Date Noted   Lisfranc dislocation, right, initial encounter    History reviewed. No pertinent past medical history.  History reviewed. No pertinent family history.  Past Surgical History:  Procedure Laterality Date   FOOT ARTHRODESIS Right 02/13/2022   Procedure: FUSION RIGHT MIDFOOT;  Surgeon: Nadara Mustard, MD;  Location: [facility] OR;  Service: Orthopedics;  Laterality: Right;   NO PAST SURGERIES     Social History   Occupational History   Not on file  Tobacco Use   Smoking status: Never   Smokeless tobacco: Never  Substance and Sexual Activity   Alcohol use: No   Drug use: No   Sexual activity: Not on file

## 2022-06-14 ENCOUNTER — Encounter: Payer: Self-pay | Admitting: Family

## 2022-06-14 ENCOUNTER — Ambulatory Visit (INDEPENDENT_AMBULATORY_CARE_PROVIDER_SITE_OTHER): Payer: Self-pay

## 2022-06-14 ENCOUNTER — Ambulatory Visit (INDEPENDENT_AMBULATORY_CARE_PROVIDER_SITE_OTHER): Payer: Self-pay | Admitting: Family

## 2022-06-14 DIAGNOSIS — M25871 Other specified joint disorders, right ankle and foot: Secondary | ICD-10-CM

## 2022-06-14 DIAGNOSIS — M25571 Pain in right ankle and joints of right foot: Secondary | ICD-10-CM

## 2022-06-14 NOTE — Progress Notes (Signed)
Office Visit Note   Patient: Tommy Potts           Date of Birth: [birth year]           MRN: [id number] Visit Date: 06/14/2022              Requested by: No referring provider defined for this encounter. PCP: Pcp, No  Chief Complaint  Patient presents with   Right Foot - Follow-up    Right midfoot fusion 02/2022      HPI: The patient is a 41 year old gentleman who presents today complaining of ankle pain and instability states this gives way.  The has been ambulating in sneakers for more support but unfortunately has good days and bad days having significant pain over the ankle joint.  Denies foot pain no pain over the midfoot or his incision  Denies any new injury or ankle injury  Assessment & Plan: Visit Diagnoses:  1. Impingement syndrome of right ankle   2. Pain in right ankle and joints of right foot     Plan: Impression is impingement syndrome right ankle.  Discussed supportive shoewear.  Depo-Medrol injection right ankle.  Patient tolerated well.  We will follow-up with him on an as-needed basis if he continues to have issues discussed possibility of MRI of the ankle.  Follow-Up Instructions: Return in about 4 weeks (around 07/12/2022), or if symptoms worsen or fail to improve.   Right Ankle Exam   Tenderness  Right ankle tenderness location: anterior joint line, sinus tarsi.  Range of Motion  The patient has normal right ankle ROM.  Tests  Anterior drawer: negative  Other  Erythema: absent Sensation: normal Pulse: present       Patient is alert, oriented, no adenopathy, well-dressed, normal affect, normal respiratory effort.   Imaging: No results found. No images are attached to the encounter.  Labs: No results found for: "HGBA1C", "ESRSEDRATE", "CRP", "LABURIC", "REPTSTATUS", "GRAMSTAIN", "CULT", "LABORGA"   No results found for: "ALBUMIN", "PREALBUMIN", "CBC"  No results found for: "MG" No results found for: "VD25OH"  No results found  for: "PREALBUMIN"    Latest Ref Rng & Units 02/13/2022    1:06 PM  CBC EXTENDED  WBC 4.0 - 10.5 K/uL 9.1   RBC 4.22 - 5.81 MIL/uL 5.87   Hemoglobin 13.0 - 17.0 g/dL 19.6   HCT 22.2 - 97.9 % 48.3   Platelets 150 - 400 K/uL 205      There is no height or weight on file to calculate BMI.  Orders:  Orders Placed This Encounter  Procedures   Medium Joint Inj   XR Ankle Complete Right   Ambulatory referral to Physical Therapy   No orders of the defined types were placed in this encounter.    Procedures: Medium Joint Inj: R ankle on 06/21/2022 5:50 AM Indications: pain Details: 25 G 1.5 in needle, anterolateral approach Medications: 2 mL lidocaine 1 %; 40 mg methylPREDNISolone acetate 40 MG/ML Outcome: tolerated well, no immediate complications Consent was given by the patient. Patient was prepped and draped in the usual sterile fashion.      Clinical Data: No additional findings.  ROS:  All other systems negative, except as noted in the HPI. Review of Systems  Constitutional:  Negative for chills and fever.  Musculoskeletal:  Positive for arthralgias and myalgias.  Neurological:  Negative for weakness and numbness.    Objective: Vital Signs: There were no vitals taken for this visit.  Specialty Comments:  No specialty comments  available.  PMFS History: Patient Active Problem List   Diagnosis Date Noted   Lisfranc dislocation, right, initial encounter    History reviewed. No pertinent past medical history.  History reviewed. No pertinent family history.  Past Surgical History:  Procedure Laterality Date   FOOT ARTHRODESIS Right 02/13/2022   Procedure: FUSION RIGHT MIDFOOT;  Surgeon: Nadara Mustard, MD;  Location: [facility] OR;  Service: Orthopedics;  Laterality: Right;   NO PAST SURGERIES     Social History   Occupational History   Not on file  Tobacco Use   Smoking status: Never   Smokeless tobacco: Never  Substance and Sexual Activity   Alcohol use: No    Drug use: No   Sexual activity: Not on file

## 2022-06-21 DIAGNOSIS — M25871 Other specified joint disorders, right ankle and foot: Secondary | ICD-10-CM

## 2022-06-21 MED ORDER — LIDOCAINE HCL 1 % IJ SOLN
2.0000 mL | INTRAMUSCULAR | Status: AC | PRN
  Administered 2022-06-21: 2 mL

## 2022-06-21 MED ORDER — METHYLPREDNISOLONE ACETATE 40 MG/ML IJ SUSP
40.0000 mg | INTRAMUSCULAR | Status: AC | PRN
  Administered 2022-06-21: 40 mg via INTRA_ARTICULAR

## 2022-07-23 ENCOUNTER — Ambulatory Visit (HOSPITAL_COMMUNITY): Payer: Self-pay | Admitting: Physical Therapy

## 2022-08-12 ENCOUNTER — Ambulatory Visit (HOSPITAL_COMMUNITY): Payer: Self-pay | Admitting: Physical Therapy

## 2022-11-12 ENCOUNTER — Encounter: Payer: Self-pay | Admitting: Orthopaedic Surgery

## 2022-11-12 ENCOUNTER — Ambulatory Visit (INDEPENDENT_AMBULATORY_CARE_PROVIDER_SITE_OTHER): Payer: Self-pay | Admitting: Orthopaedic Surgery

## 2022-11-12 VITALS — BP 119/79 | HR 78 | Ht 65.0 in | Wt 175.0 lb

## 2022-11-12 DIAGNOSIS — S93324A Dislocation of tarsometatarsal joint of right foot, initial encounter: Secondary | ICD-10-CM

## 2022-11-12 NOTE — Patient Instructions (Addendum)
Call [facility] to schedule a follow up appt with Dr.Duda. He is the foot specialist for our group. Due to the seriousness of your previous injury and the fact that you had surgery, you need to follow up with the surgeon.  [place] PHONE: [phone]  It is common to have pain and swelling after an injury and surgery like yours.

## 2022-11-12 NOTE — Progress Notes (Signed)
He had Lisfranc dislocation of the right foot and had surgery by Dr. Sharol Given last year.  I feel that he needs to see Dr. Sharol Given for further care of this significant injury.    I talked to him via Opt[facility].  He appears to understand.  No treatment, no charge.   Encounter Diagnosis  Name Primary?   Lisfranc dislocation, right, initial encounter Yes    Electronically Signed Sanjuana Kava, MD 2/6/20243:21 PM

## 2023-10-09 IMAGING — CT CT FOOT*R* W/O CM
2 of 6 series · 9 of 26 positions shown, 10 images · non-contrast
Comparison: None Available.

CLINICAL DATA: Fracture, foot Eyal Priego dislocation right foot

EXAM:
CT OF THE RIGHT FOOT WITHOUT CONTRAST
TECHNIQUE: Multidetector CT imaging of the right foot was performed according
to the standard protocol. Multiplanar CT image reconstructions were
also generated.
RADIATION DOSE REDUCTION: This exam was performed according to the
departmental dose-optimization program which includes automated
exposure control, adjustment of the mA and/or kV according to
patient size and/or use of iterative reconstruction technique.

[Series 5: axial bone · oblique · 0.31mm/px · 3 of 268 slices shown, 4 images]
[im 1/268  soft-tissue]
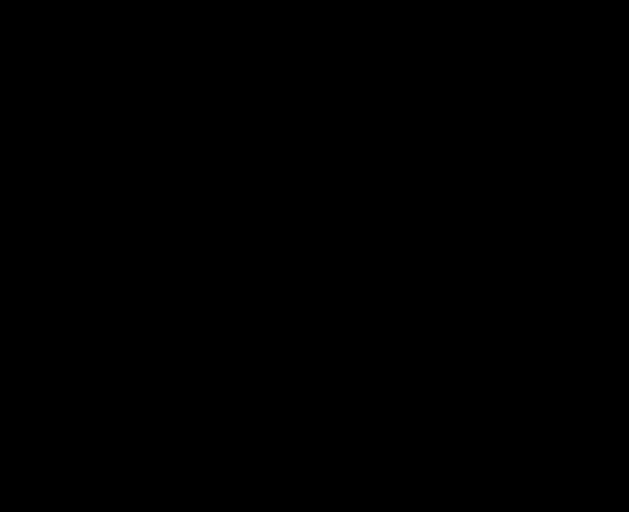
[im 1/268  bone]
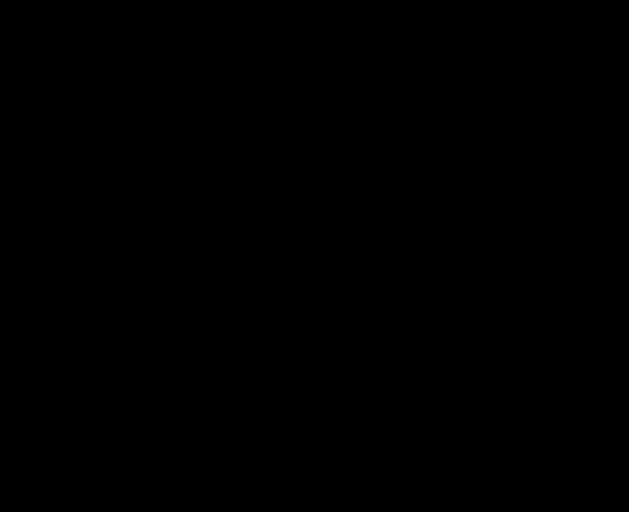
[im 134/268  bone]
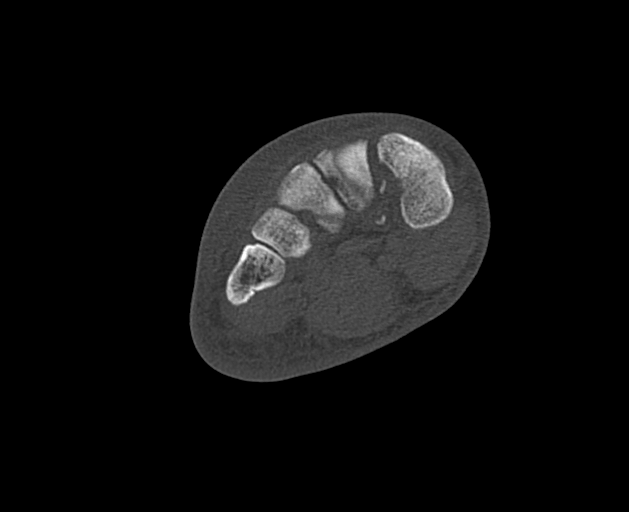
[im 268/268  bone]
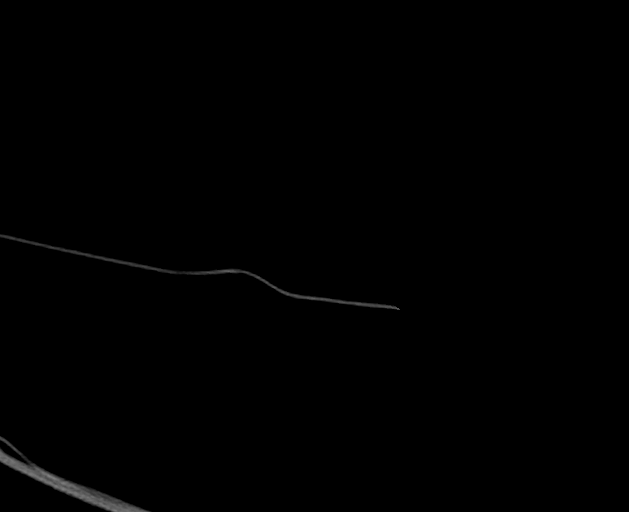

[Series 6: sag bone · sagittal · 0.36mm/px · 6 of 130 slices shown]
[im 22/130  bone]
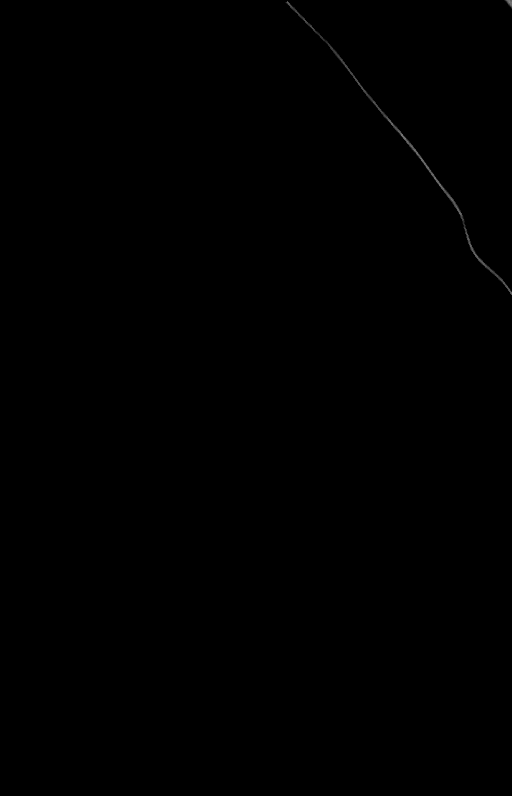
[im 44/130  bone]
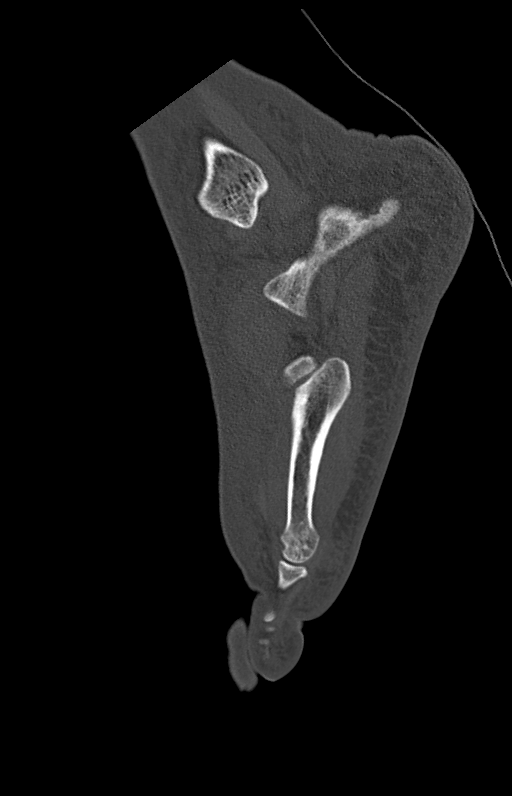
[im 45/130  soft-tissue]
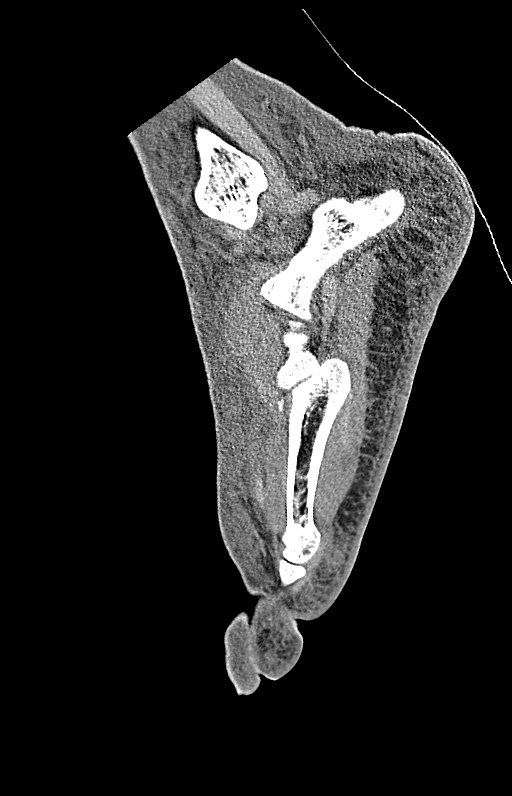
[im 65/130  bone]
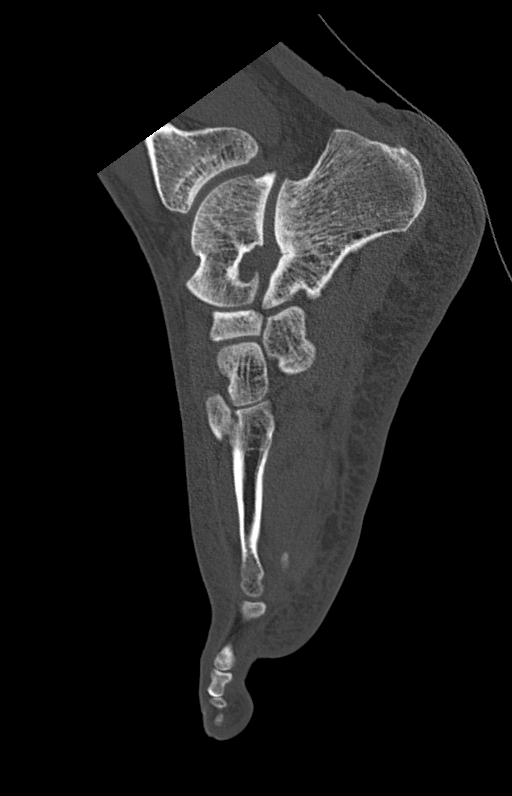
[im 87/130  bone]
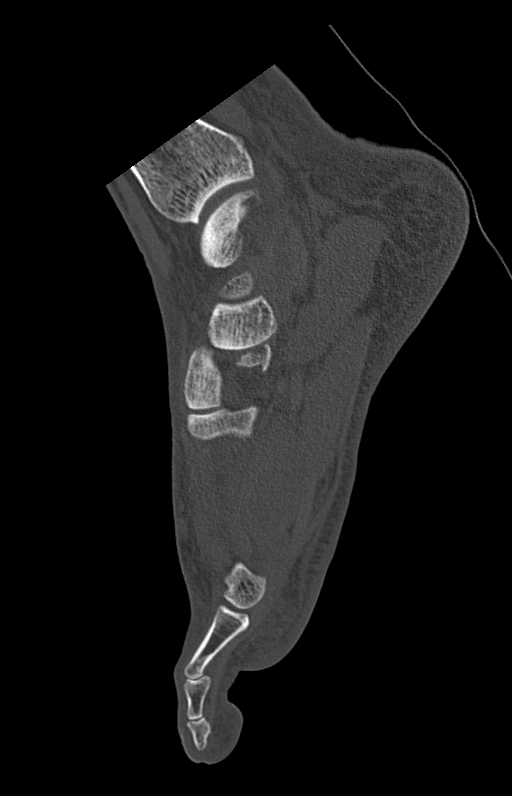
[im 108/130  bone]
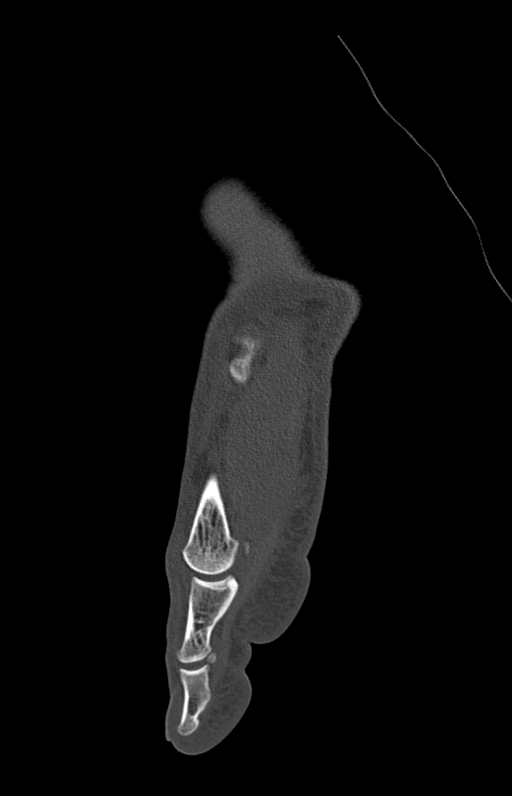

[9 of 26 positions shown; findings below may reference images not displayed]

FINDINGS: Bones/Joint/Cartilage

Lisfranc fracture-dislocation. Comminuted and displaced fracture
involving the head plantar lateral base of the medial cuneiform.
Small fracture along the plantar aspect of the second metatarsal and
small fracture of the plantar medial aspect of the middle cuneiform.
There are multiple small fracture fragments in the Lisfranc
interval.

There is medial and dorsal subluxation of the medial cuneiform with
respect to the navicular, with comminuted fracture of the medial
navicular due to impaction by the medial cuneiform. There is medial
subluxation of the first metatarsal and medial cuneiform with
widening of primarily the mid and plantar aspect of Lisfranc
interval. The first and second metatarsals are well aligned with
their respective TMT articulations with the medial and middle
cuneiforms.

Ligaments

Widened Lisfranc interval with midfoot malalignment suggestive of
Lisfranc ligament tear.

Muscles and Tendons

No acute myotendinous abnormality by CT.

Soft tissues

Extensive soft tissue swelling of the foot.
IMPRESSION: Lisfranc fracture-dislocation, with fractures involving the plantar
lateral base of the medial cuneiform, plantar aspect of the second
metatarsal, and plantar medial aspect of the middle cuneiform.
Multiple small fracture fragments in the Lisfranc interval.

Medial and dorsal subluxation of the medial cuneiform, with
comminuted fracture of the medial navicular due to impaction by the
medial cuneiform.
